# Patient Record
Sex: Female | Born: 2005 | Race: White | Hispanic: No | Marital: Single | Smoking: Never smoker
Health system: Southern US, Community
[De-identification: ages and names within clinical notes are randomized; demographics above are authoritative.]

## PROBLEM LIST (undated history)

## (undated) DIAGNOSIS — Q8501 Neurofibromatosis, type 1: Secondary | ICD-10-CM

## (undated) DIAGNOSIS — C7230 Malignant neoplasm of unspecified optic nerve: Secondary | ICD-10-CM

## (undated) HISTORY — PX: OTHER SURGICAL HISTORY: SHX169

---
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
## 2024-01-05 DIAGNOSIS — F902 Attention-deficit hyperactivity disorder, combined type: Secondary | ICD-10-CM | POA: Diagnosis not present

## 2024-01-05 DIAGNOSIS — F3341 Major depressive disorder, recurrent, in partial remission: Secondary | ICD-10-CM | POA: Diagnosis not present

## 2024-01-05 DIAGNOSIS — F4312 Post-traumatic stress disorder, chronic: Secondary | ICD-10-CM | POA: Diagnosis not present

## 2024-01-05 DIAGNOSIS — F411 Generalized anxiety disorder: Secondary | ICD-10-CM | POA: Diagnosis not present

## 2024-01-22 DIAGNOSIS — F411 Generalized anxiety disorder: Secondary | ICD-10-CM | POA: Diagnosis not present

## 2024-01-22 DIAGNOSIS — F4312 Post-traumatic stress disorder, chronic: Secondary | ICD-10-CM | POA: Diagnosis not present

## 2024-02-05 DIAGNOSIS — F4312 Post-traumatic stress disorder, chronic: Secondary | ICD-10-CM | POA: Diagnosis not present

## 2024-02-05 DIAGNOSIS — F411 Generalized anxiety disorder: Secondary | ICD-10-CM | POA: Diagnosis not present

## 2024-02-16 DIAGNOSIS — M542 Cervicalgia: Secondary | ICD-10-CM | POA: Diagnosis not present

## 2024-02-16 DIAGNOSIS — G43719 Chronic migraine without aura, intractable, without status migrainosus: Secondary | ICD-10-CM | POA: Diagnosis not present

## 2024-03-11 DIAGNOSIS — M79642 Pain in left hand: Secondary | ICD-10-CM | POA: Diagnosis not present

## 2024-03-11 DIAGNOSIS — M0239 Reiter's disease, multiple sites: Secondary | ICD-10-CM | POA: Diagnosis not present

## 2024-03-11 DIAGNOSIS — R700 Elevated erythrocyte sedimentation rate: Secondary | ICD-10-CM | POA: Diagnosis not present

## 2024-03-11 DIAGNOSIS — R7982 Elevated C-reactive protein (CRP): Secondary | ICD-10-CM | POA: Diagnosis not present

## 2024-03-18 DIAGNOSIS — F4312 Post-traumatic stress disorder, chronic: Secondary | ICD-10-CM | POA: Diagnosis not present

## 2024-04-01 DIAGNOSIS — F4312 Post-traumatic stress disorder, chronic: Secondary | ICD-10-CM | POA: Diagnosis not present

## 2024-04-02 DIAGNOSIS — C7231 Malignant neoplasm of right optic nerve: Secondary | ICD-10-CM | POA: Diagnosis not present

## 2024-04-02 DIAGNOSIS — I6522 Occlusion and stenosis of left carotid artery: Secondary | ICD-10-CM | POA: Diagnosis not present

## 2024-04-02 DIAGNOSIS — I659 Occlusion and stenosis of unspecified precerebral artery: Secondary | ICD-10-CM | POA: Diagnosis not present

## 2024-04-02 DIAGNOSIS — C7230 Malignant neoplasm of unspecified optic nerve: Secondary | ICD-10-CM | POA: Diagnosis not present

## 2024-04-02 DIAGNOSIS — D333 Benign neoplasm of cranial nerves: Secondary | ICD-10-CM | POA: Diagnosis not present

## 2024-04-02 DIAGNOSIS — Q8501 Neurofibromatosis, type 1: Secondary | ICD-10-CM | POA: Diagnosis not present

## 2024-04-02 DIAGNOSIS — I6621 Occlusion and stenosis of right posterior cerebral artery: Secondary | ICD-10-CM | POA: Diagnosis not present

## 2024-04-02 DIAGNOSIS — I675 Moyamoya disease: Secondary | ICD-10-CM | POA: Diagnosis not present

## 2024-04-02 DIAGNOSIS — H47293 Other optic atrophy, bilateral: Secondary | ICD-10-CM | POA: Diagnosis not present

## 2024-04-12 DIAGNOSIS — F411 Generalized anxiety disorder: Secondary | ICD-10-CM | POA: Diagnosis not present

## 2024-04-12 DIAGNOSIS — F902 Attention-deficit hyperactivity disorder, combined type: Secondary | ICD-10-CM | POA: Diagnosis not present

## 2024-04-14 DIAGNOSIS — F411 Generalized anxiety disorder: Secondary | ICD-10-CM | POA: Diagnosis not present

## 2024-04-14 DIAGNOSIS — F4312 Post-traumatic stress disorder, chronic: Secondary | ICD-10-CM | POA: Diagnosis not present

## 2024-04-15 DIAGNOSIS — Z6828 Body mass index (BMI) 28.0-28.9, adult: Secondary | ICD-10-CM | POA: Diagnosis not present

## 2024-04-15 DIAGNOSIS — Z01419 Encounter for gynecological examination (general) (routine) without abnormal findings: Secondary | ICD-10-CM | POA: Diagnosis not present

## 2024-05-13 DIAGNOSIS — F411 Generalized anxiety disorder: Secondary | ICD-10-CM | POA: Diagnosis not present

## 2024-05-13 DIAGNOSIS — F4312 Post-traumatic stress disorder, chronic: Secondary | ICD-10-CM | POA: Diagnosis not present
